# Patient Record
Sex: Male | Born: 1970 | Race: White | Hispanic: No | Marital: Single
Health system: Southern US, Community
[De-identification: ages and names within clinical notes are randomized; demographics above are authoritative.]

---

## 2015-12-04 ENCOUNTER — Ambulatory Visit (HOSPITAL_COMMUNITY)
Admission: EM | Admit: 2015-12-04 | Discharge: 2015-12-04 | Disposition: A | Discharge status: Home / Self Care | Payer: BLUE CROSS/BLUE SHIELD | Attending: Family Medicine | Admitting: Family Medicine

## 2015-12-04 ENCOUNTER — Ambulatory Visit (INDEPENDENT_AMBULATORY_CARE_PROVIDER_SITE_OTHER): Payer: BLUE CROSS/BLUE SHIELD

## 2015-12-04 ENCOUNTER — Encounter (HOSPITAL_COMMUNITY): Payer: Self-pay | Admitting: *Deleted

## 2015-12-04 DIAGNOSIS — M7531 Calcific tendinitis of right shoulder: Secondary | ICD-10-CM

## 2015-12-04 MED ORDER — KETOROLAC TROMETHAMINE 30 MG/ML IJ SOLN
INTRAMUSCULAR | Status: AC
  Filled 2015-12-04: qty 1

## 2015-12-04 MED ORDER — KETOROLAC TROMETHAMINE 30 MG/ML IJ SOLN
30.0000 mg | Freq: Once | INTRAMUSCULAR | Status: AC
  Administered 2015-12-04: 30 mg via INTRAMUSCULAR

## 2015-12-04 MED ORDER — DICLOFENAC POTASSIUM 50 MG PO TABS: 50.0000 mg | ORAL_TABLET | Freq: Three times a day (TID) | ORAL | Status: AC
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
## 2017-11-11 IMAGING — DX DG SHOULDER 2+V*R*
3 series · 3 of 3 positions shown · non-contrast
Comparison: None.

CLINICAL DATA: Right shoulder pain, limited range of motion

EXAM:
RIGHT SHOULDER - 2+ VIEW

[shoulder ap (1 of 2)]
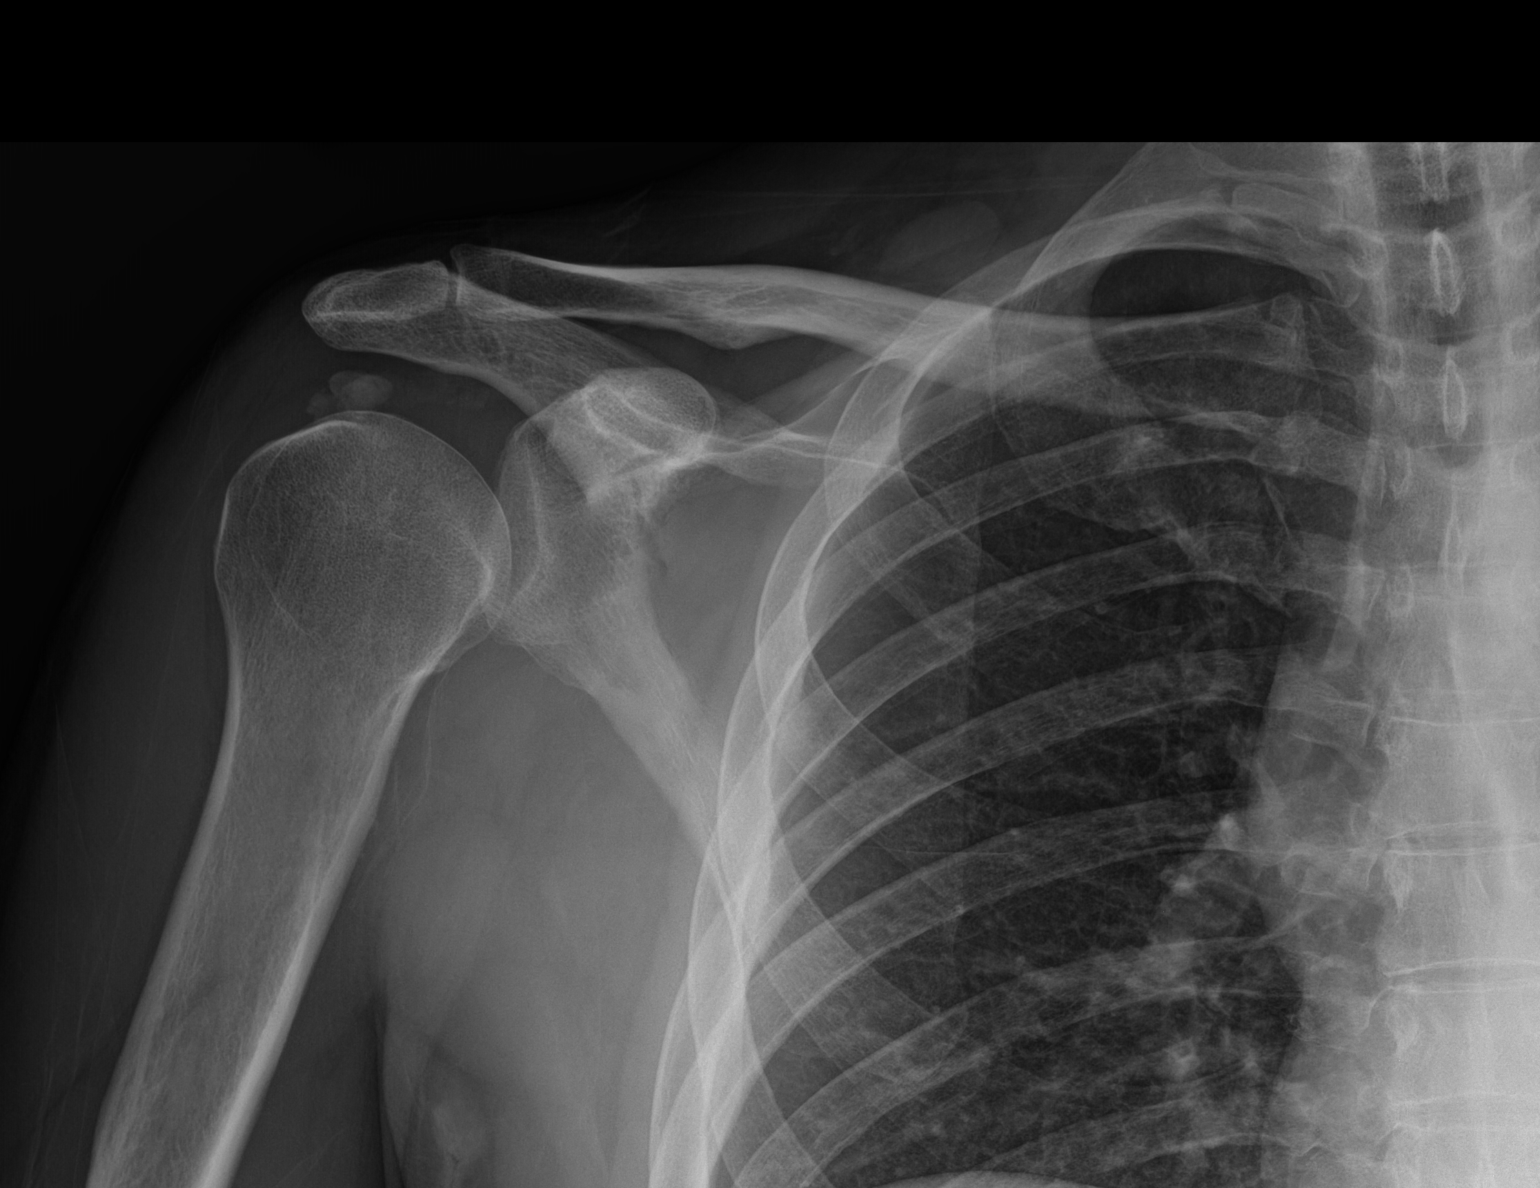

[shoulder ap (2 of 2)]
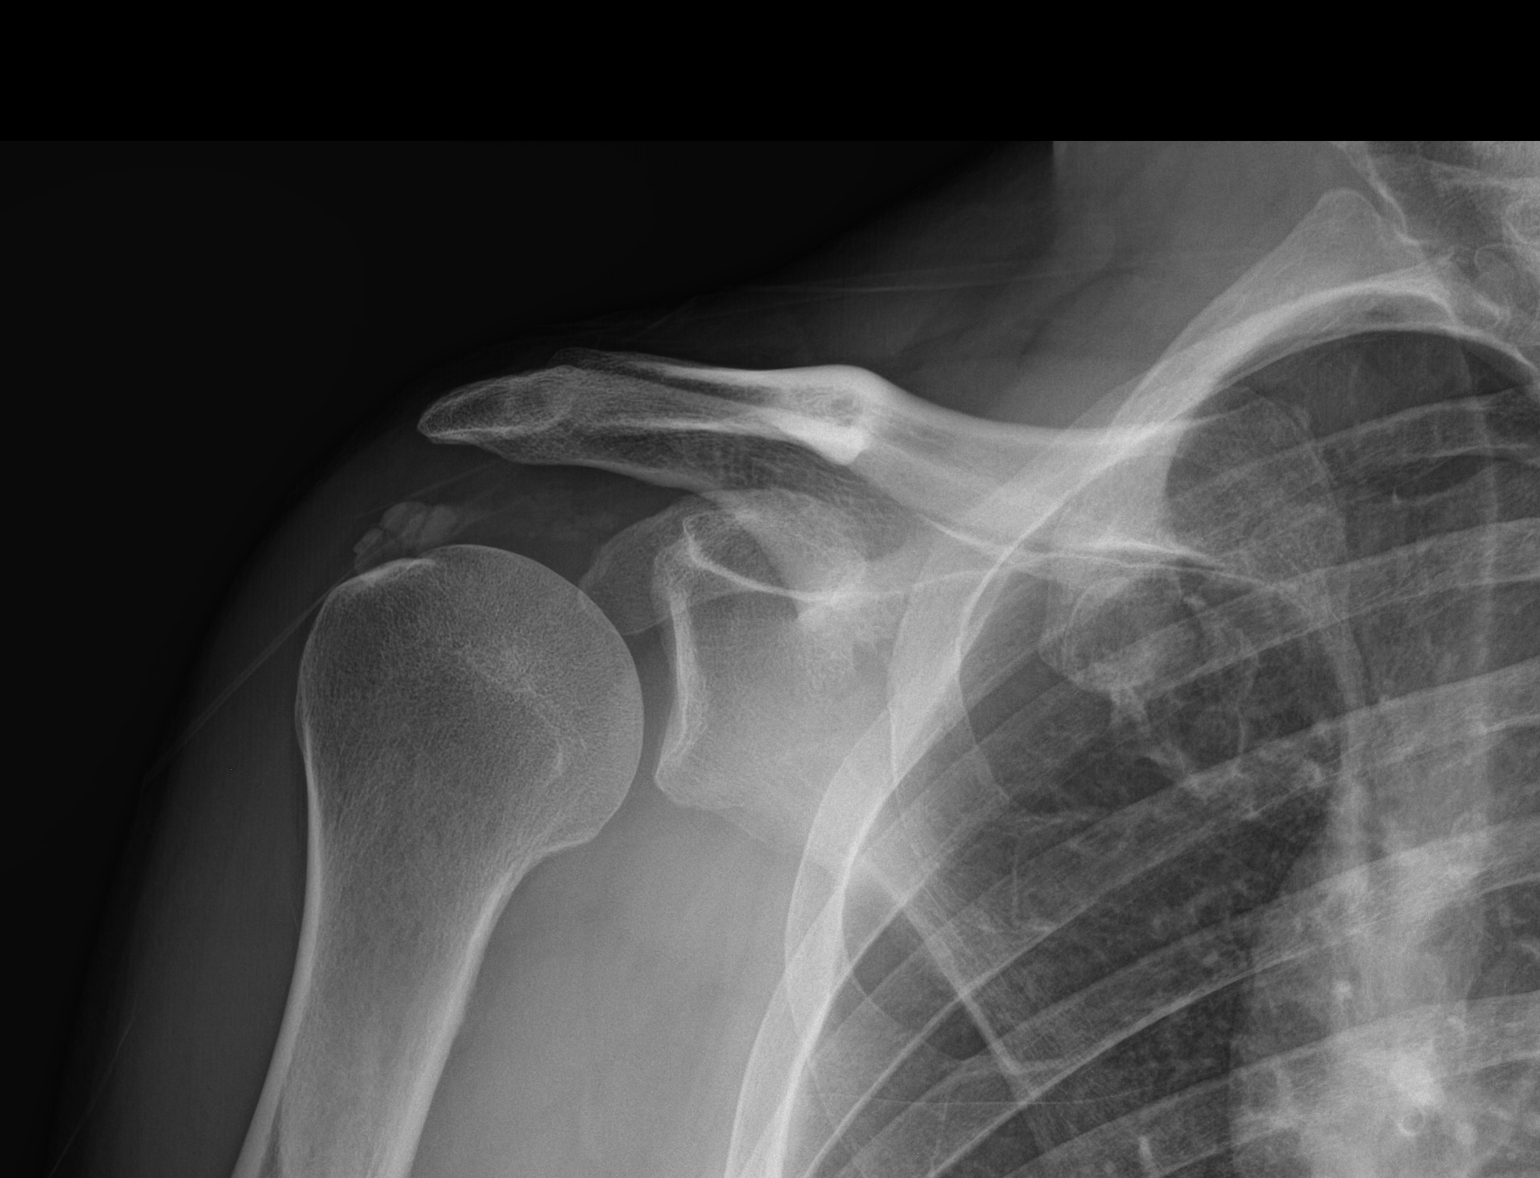

[shoulder y view]
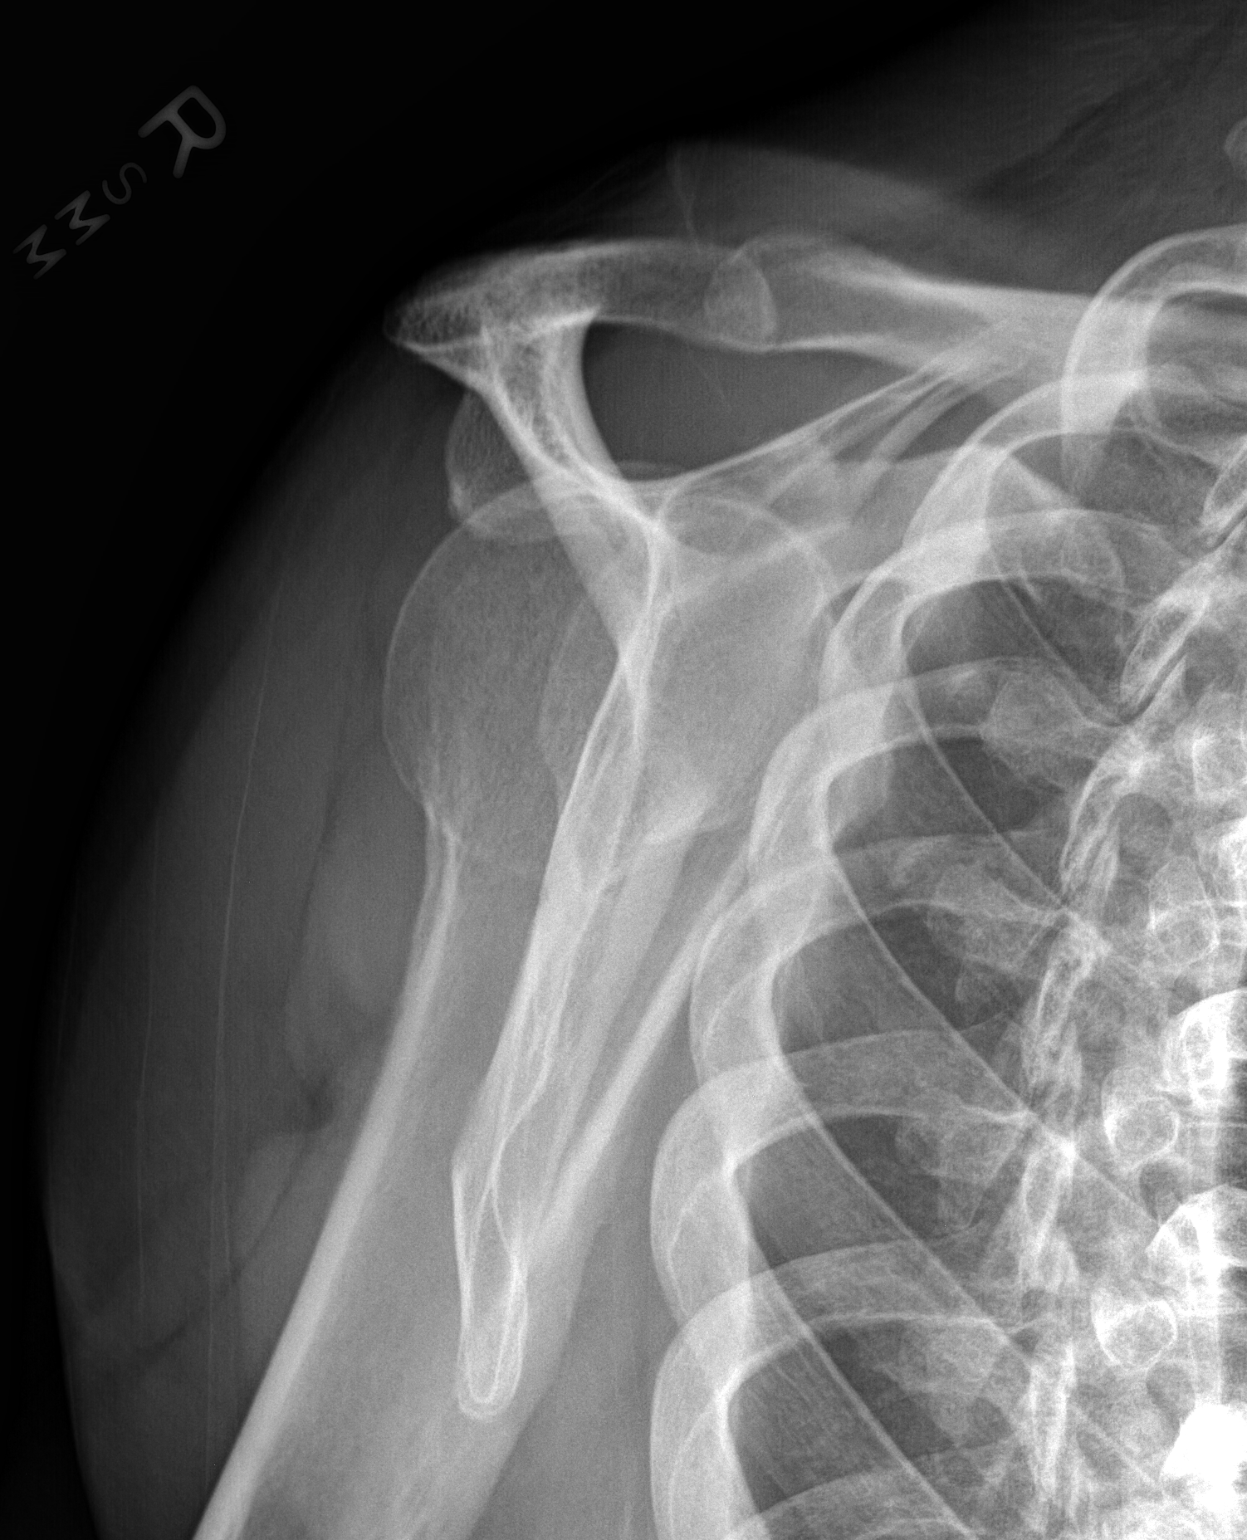

[3 of 3 positions shown; findings below may reference images not displayed]

FINDINGS: Three views of the right shoulder submitted. No acute fracture or
subluxation. AC joint and glenohumeral joint are preserved.
Clustered calcifications are noted adjacent to superolateral aspect
of humeral head the largest measures 1.2 cm highly suspicious for
calcific tendinitis
IMPRESSION: No acute fracture or subluxation. AC joint and glenohumeral joint
are preserved. Clustered calcifications are noted adjacent to
superolateral aspect of humeral head the largest measures 1.2 cm
highly suspicious for calcific tendinitis.
